# Patient Record
Sex: Female | Born: 1960 | Race: White | Hispanic: No | Smoking: Never smoker
Health system: Southern US, Community
[De-identification: ages and names within clinical notes are randomized; demographics above are authoritative.]

## PROBLEM LIST (undated history)

## (undated) DIAGNOSIS — C187 Malignant neoplasm of sigmoid colon: Secondary | ICD-10-CM

## (undated) DIAGNOSIS — N2889 Other specified disorders of kidney and ureter: Secondary | ICD-10-CM

---

## 2005-05-01 ENCOUNTER — Encounter: Admission: RE | Admit: 2005-05-01 | Discharge: 2005-05-01 | Payer: Self-pay | Admitting: Occupational Medicine

## 2005-05-24 ENCOUNTER — Encounter: Admission: RE | Admit: 2005-05-24 | Discharge: 2005-05-24 | Payer: Self-pay | Admitting: Occupational Medicine

## 2007-07-22 IMAGING — CR DG FOREARM 2V*R*
1 series · 1 of 1 positions shown · non-contrast
Comparison: None.

CLINICAL DATA: Fell ? swelling and bruising in medial forearm. 
 RIGHT FOREARM ? 2 VIEW:

[view not recorded]
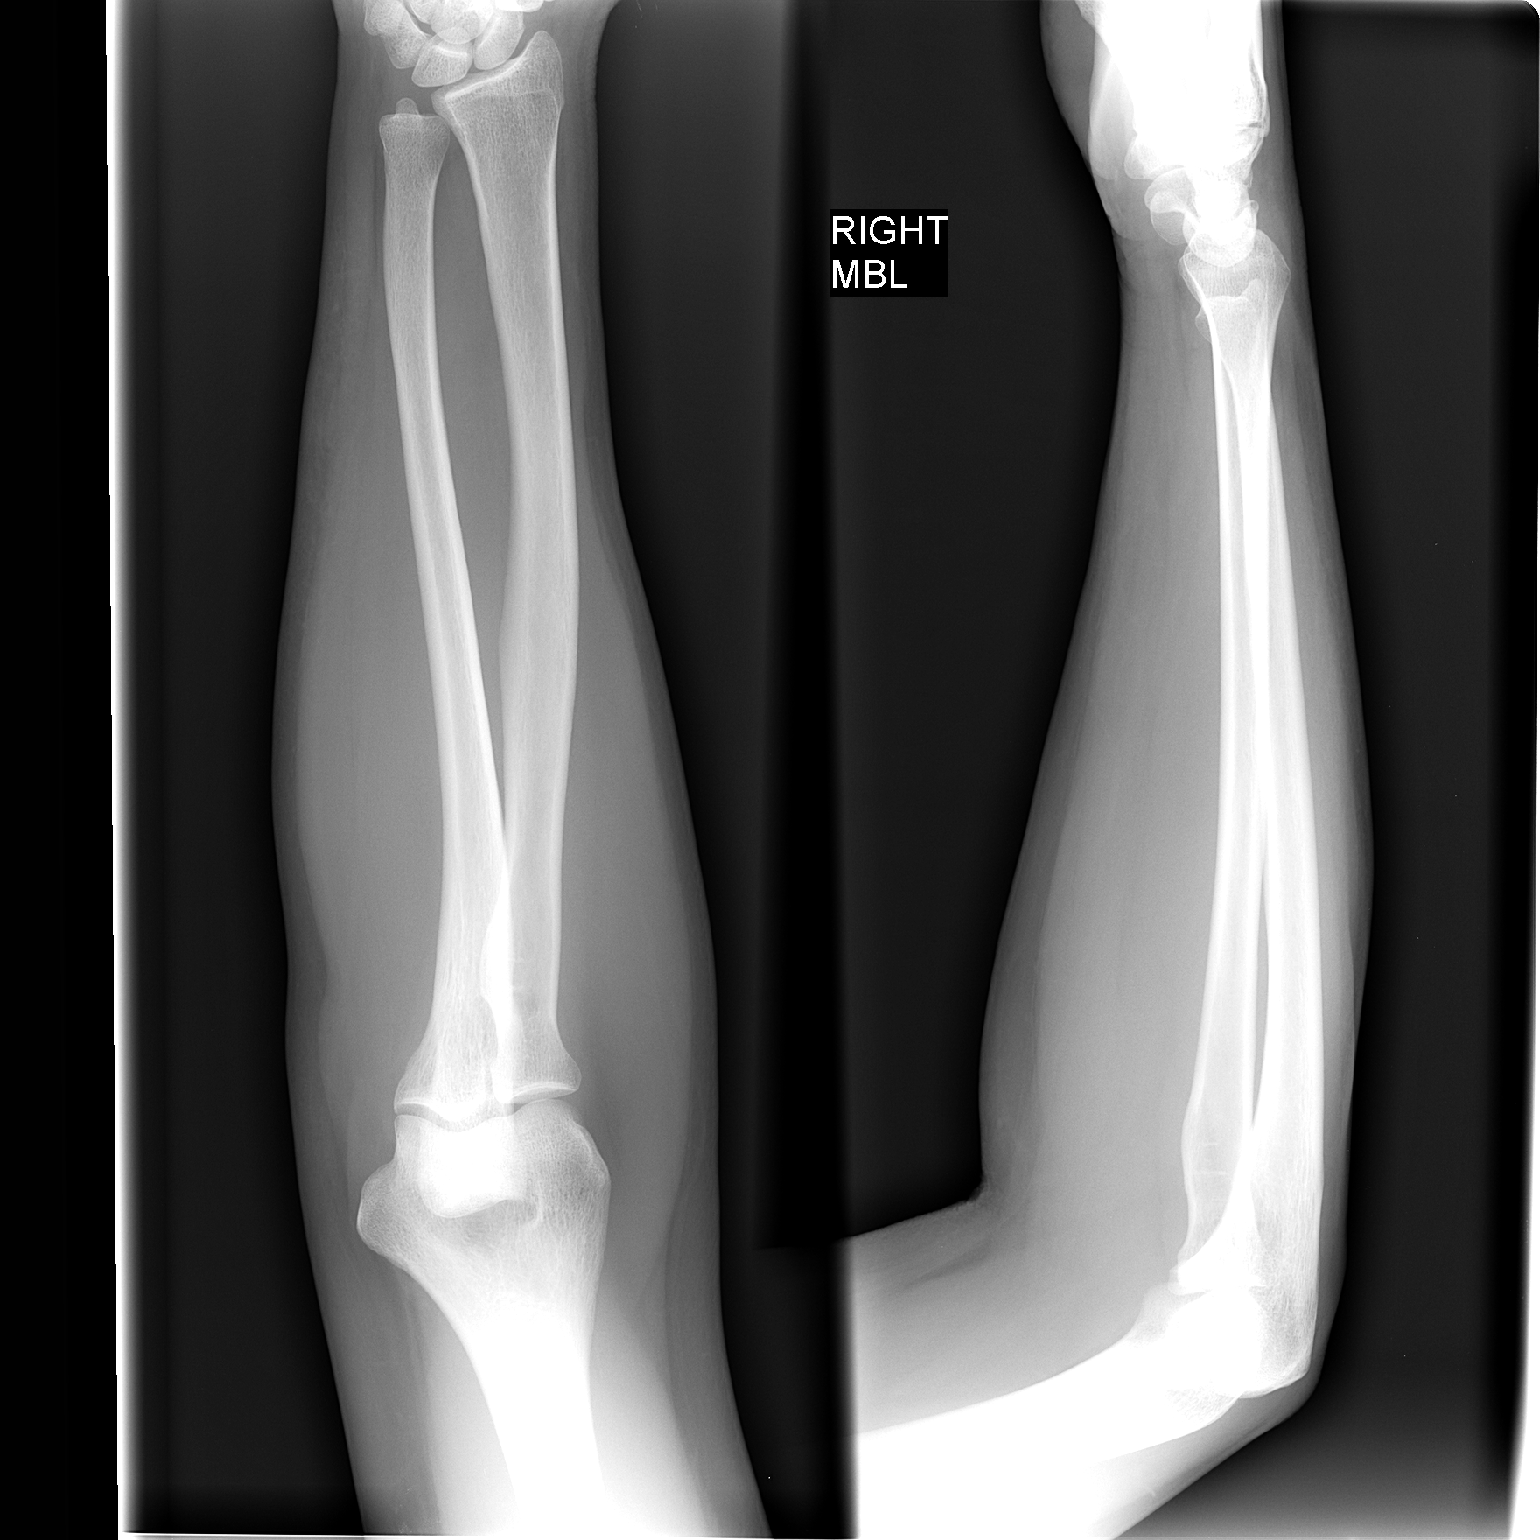

[1 of 1 positions shown; findings below may reference images not displayed]

FINDINGS: There is no evidence of fracture or other focal bone lesions.  Soft tissues are unremarkable.
IMPRESSION: Negative.

## 2016-03-23 ENCOUNTER — Other Ambulatory Visit: Payer: Self-pay | Admitting: Urology

## 2016-03-23 DIAGNOSIS — N2889 Other specified disorders of kidney and ureter: Secondary | ICD-10-CM

## 2016-04-20 ENCOUNTER — Ambulatory Visit
Admission: RE | Admit: 2016-04-20 | Discharge: 2016-04-20 | Disposition: A | Discharge status: Home / Self Care | Payer: Commercial Managed Care - PPO | Source: Ambulatory Visit | Attending: Urology | Admitting: Urology

## 2016-04-20 ENCOUNTER — Encounter: Payer: Self-pay | Admitting: Radiology

## 2016-04-20 DIAGNOSIS — N2889 Other specified disorders of kidney and ureter: Secondary | ICD-10-CM

## 2016-04-20 HISTORY — PX: IR RADIOLOGIST EVAL & MGMT: IMG5224

## 2016-04-20 NOTE — Consult Note (Signed)
Chief Complaint:  Small right inferior renal cell carcinoma. Assess for ablation.   Referring Physician(s): Hall,Marshall C  History of Present Illness: Jaclyn Clarke is a 56 y.o. female with a history of sigmoid colon cancer status post recent partial colectomy and hysterectomy. She is currently undergoing chemotherapy. During her workup and imaging evaluation a small right renal cell carcinoma was incidentally found. This lesion is posterior inferior in the right kidney and slightly exophytic. Maximal dimensions 1.5 cm. She presents today to review treatment options including cryoablation with image guidance. She remains asymptomatic. No current significant abdominal pain, flank pain, dysuria or hematuria. She is mildly fatigued related to the recent chemotherapy. Intermittent nausea.  No past medical history on file.  No past surgical history on file.  Allergies: Codeine; Levofloxacin; Nickel; Chlorthalidone; Hydrochlorothiazide; Influenza vaccines; Penicillins; Propranolol; Eggs or egg-derived products; and Sulfa antibiotics  Medications: Prior to Admission medications   Not on File     No family history on file.  Social History   Social History  . Marital status: Unknown    Spouse name: N/A  . Number of children: N/A  . Years of education: N/A   Social History Main Topics  . Smoking status: Never Smoker  . Smokeless tobacco: Never Used  . Alcohol use No  . Drug use: Unknown  . Sexual activity: Not Asked   Other Topics Concern  . None   Social History Narrative  . None    ECOG Status: 1 - Symptomatic but completely ambulatory  Review of Systems: A 12 point ROS discussed and pertinent positives are indicated in the HPI above.  All other systems are negative.  Review of Systems  Vital Signs: BP 100/69 (BP Location: Right Arm, Patient Position: Sitting, Cuff Size: Normal)   Pulse 94   Temp 99.2 F (37.3 C) (Oral)   Resp 14   Ht 5\' 7"  (1.702 m)    Wt 165 lb (74.8 kg)   SpO2 98%   BMI 25.84 kg/m   Physical Exam  Constitutional: She is oriented to person, place, and time. She appears well-developed and well-nourished. No distress.  Eyes: Conjunctivae are normal. No scleral icterus.  Cardiovascular: Normal rate, regular rhythm and normal heart sounds.   No murmur heard. Pulmonary/Chest: Effort normal and breath sounds normal. No respiratory distress.  Abdominal: Soft. Bowel sounds are normal. She exhibits no distension.  Midline inferior umbilical scar is well-healed.  Musculoskeletal: Normal range of motion. She exhibits no edema.  Right anterior chest port site is well-healed.  Neurological: She is alert and oriented to person, place, and time.  Skin: Skin is dry. No rash noted. She is not diaphoretic. No erythema.  Psychiatric: She has a normal mood and affect. Her behavior is normal. Thought content normal.    Mallampati Score:   2  Imaging: No results found.  Labs:  CBC: No results for input(s): WBC, HGB, HCT, PLT in the last 8760 hours.  COAGS: No results for input(s): INR, APTT in the last 8760 hours.  BMP: No results for input(s): NA, K, CL, CO2, GLUCOSE, BUN, CALCIUM, CREATININE, GFRNONAA, GFRAA in the last 8760 hours.  Invalid input(s): CMP  LIVER FUNCTION TESTS: No results for input(s): BILITOT, AST, ALT, ALKPHOS, PROT, ALBUMIN in the last 8760 hours.  TUMOR MARKERS: No results for input(s): AFPTM, CEA, CA199, CHROMGRNA in the last 8760 hours.  Assessment and Plan:  1.5 cm posterior inferior right renal cell carcinoma. Lesion size and location  are amenable to image guided cryoablation. The procedure, risks, benefits and alternatives were reviewed. Expected goals, outcomes and surveillance also reviewed. She has a clear understanding of the procedure. All questions were addressed. She would like to schedule the procedure electively in late May when she has completed chemotherapy for colon cancer.  Plan:  Schedule for CT-guided right renal cell carcinoma cryoablation in late May at [facility] once she has completed chemotherapy.  Thank you for this interesting consult.  I greatly enjoyed meeting Shealeigh Dunstan and look forward to participating in their care.  A copy of this report was sent to the requesting provider on this date.  Electronically Signed: Greggory Keen 04/20/2016, 4:15 PM   I spent a total of  40 Minutes   in face to face in clinical consultation, greater than 50% of which was counseling/coordinating care for this patient with a small right renal cell carcinoma.

## 2016-06-05 ENCOUNTER — Other Ambulatory Visit (HOSPITAL_COMMUNITY): Payer: Self-pay | Admitting: Interventional Radiology

## 2016-06-05 ENCOUNTER — Other Ambulatory Visit: Payer: Commercial Managed Care - PPO

## 2016-06-05 DIAGNOSIS — N2889 Other specified disorders of kidney and ureter: Secondary | ICD-10-CM

## 2016-06-20 ENCOUNTER — Other Ambulatory Visit: Payer: Commercial Managed Care - PPO

## 2016-06-30 ENCOUNTER — Encounter: Payer: Self-pay | Admitting: Interventional Radiology

## 2016-07-06 ENCOUNTER — Ambulatory Visit
Admission: RE | Admit: 2016-07-06 | Discharge: 2016-07-06 | Disposition: A | Discharge status: Home / Self Care | Payer: Commercial Managed Care - PPO | Source: Ambulatory Visit | Attending: Interventional Radiology | Admitting: Interventional Radiology

## 2016-07-06 DIAGNOSIS — N2889 Other specified disorders of kidney and ureter: Secondary | ICD-10-CM

## 2016-07-06 HISTORY — PX: IR RADIOLOGIST EVAL & MGMT: IMG5224

## 2016-07-06 NOTE — Progress Notes (Signed)
Patient ID: Jaclyn Clarke, female   DOB: 1961-01-04, 57 y.o.   MRN: [id number]       Chief Complaint: 1 month status post right renal cell carcinoma cryoablation.  Referring Physician(s): Awab Abebe  History of Present Illness: Jaclyn Clarke is a 56 y.o. female with a history of sigmoid colon cancer, status post partial colectomy and hysterectomy. She has completed chemotherapy for the colon cancer. Surveillance imaging and workup demonstrated an incidental small right renal cell carcinoma measuring 1.5 cm. This was successfully treated with image guided cryoablation 4 weeks ago at [facility]. Procedure went without complication. She was discharged the same day. She recovered at home very well over the last month. No significant flank pain, abdominal pain, dysuria or hematuria. No interval fevers. Overall she is doing very well.  No past medical history on file.  Past Surgical History:  Procedure Laterality Date  . IR RADIOLOGIST EVAL & MGMT  04/20/2016    Allergies: Codeine; Levofloxacin; Nickel; Chlorthalidone; Hydrochlorothiazide; Influenza vaccines; Penicillins; Propranolol; Eggs or egg-derived products; and Sulfa antibiotics  Medications: Prior to Admission medications   Medication Sig Start Date End Date Taking? Authorizing Provider  clonazePAM (KLONOPIN) 0.5 MG tablet Take 0.5 mg by mouth. 11/16/15  Yes [provider]  clonazePAM (KLONOPIN) 0.5 MG tablet TAKE ONE TABLET BY MOUTH ONCE DAILY AS NEEDED FOR ANXIETY. 04/21/16  Yes [provider]  fluticasone (FLONASE) 50 MCG/ACT nasal spray Place into the nose. 04/02/14  Yes [provider]  lisinopril (PRINIVIL,ZESTRIL) 10 MG tablet Take 10 mg by mouth. 11/22/15  Yes [provider]  ondansetron (ZOFRAN) 8 MG tablet Take 8 mg by mouth. 12/16/15 12/15/16 Yes [provider]  prochlorperazine (COMPAZINE) 10 MG tablet Take 10 mg by mouth. 12/16/15  Yes [provider]    triamcinolone cream (KENALOG) 0.1 % Apply topically. 11/29/15  Yes [provider]     No family history on file.  Social History   Social History  . Marital status: Unknown    Spouse name: N/A  . Number of children: N/A  . Years of education: N/A   Social History Main Topics  . Smoking status: Never Smoker  . Smokeless tobacco: Never Used  . Alcohol use No  . Drug use: Unknown  . Sexual activity: Not on file   Other Topics Concern  . Not on file   Social History Narrative  . No narrative on file    ECOG Status: 0 - Asymptomatic  Review of Systems: A 12 point ROS discussed and pertinent positives are indicated in the HPI above.  All other systems are negative.  Review of Systems  Vital Signs: BP (!) 147/83   Pulse 65   Temp 98.3 F (36.8 C) (Oral)   Resp 14   Ht 5\' 7"  (1.702 m)   Wt 164 lb (74.4 kg)   SpO2 98%   BMI 25.69 kg/m   Physical Exam  Constitutional: She is oriented to person, place, and time. She appears well-developed and well-nourished. No distress.  Eyes: Conjunctivae are normal. No scleral icterus.  Cardiovascular: Normal rate and regular rhythm.   No murmur heard. Pulmonary/Chest: Effort normal and breath sounds normal. No respiratory distress.  Abdominal: Soft. Bowel sounds are normal. She exhibits no distension.  Musculoskeletal: She exhibits no edema or deformity.  Neurological: She is alert and oriented to person, place, and time.  Skin: Skin is warm and dry. She is not diaphoretic. No erythema.  Psychiatric: She has a normal mood  and affect. Her behavior is normal.    Mallampati Score:   1  Imaging: No results found.  Labs:  CBC: No results for input(s): WBC, HGB, HCT, PLT in the last 8760 hours.  COAGS: No results for input(s): INR, APTT in the last 8760 hours.  BMP: No results for input(s): NA, K, CL, CO2, GLUCOSE, BUN, CALCIUM, CREATININE, GFRNONAA, GFRAA in the last 8760 hours.  Invalid input(s):  CMP  LIVER FUNCTION TESTS: No results for input(s): BILITOT, AST, ALT, ALKPHOS, PROT, ALBUMIN in the last 8760 hours.  TUMOR MARKERS: No results for input(s): AFPTM, CEA, CA199, CHROMGRNA in the last 8760 hours.  Assessment and Plan:  1 month status post right renal cell carcinoma cryoablation. Overall she is recovered very well. She is currently asymptomatic.  Plan: Schedule initial surveillance imaging in 3 months with a CT without and with contrast at [place].  Electronically Signed: Greggory Keen 07/06/2016, 9:30 AM   I spent a total of    25 Minutes in face to face in clinical consultation, greater than 50% of which was counseling/coordinating care for this patient with a right renal cell carcinoma.

## 2016-10-03 ENCOUNTER — Other Ambulatory Visit (HOSPITAL_COMMUNITY): Payer: Self-pay | Admitting: Interventional Radiology

## 2016-10-03 ENCOUNTER — Other Ambulatory Visit: Payer: Self-pay | Admitting: Radiology

## 2016-10-03 DIAGNOSIS — N2889 Other specified disorders of kidney and ureter: Secondary | ICD-10-CM

## 2016-10-03 DIAGNOSIS — C641 Malignant neoplasm of right kidney, except renal pelvis: Secondary | ICD-10-CM

## 2016-10-31 ENCOUNTER — Ambulatory Visit
Admission: RE | Admit: 2016-10-31 | Discharge: 2016-10-31 | Disposition: A | Discharge status: Home / Self Care | Payer: Commercial Managed Care - PPO | Source: Ambulatory Visit | Attending: Interventional Radiology | Admitting: Interventional Radiology

## 2016-10-31 DIAGNOSIS — N2889 Other specified disorders of kidney and ureter: Secondary | ICD-10-CM

## 2016-10-31 HISTORY — DX: Other specified disorders of kidney and ureter: N28.89

## 2016-10-31 HISTORY — PX: IR RADIOLOGIST EVAL & MGMT: IMG5224

## 2016-10-31 HISTORY — DX: Malignant neoplasm of sigmoid colon: C18.7

## 2016-10-31 NOTE — Progress Notes (Signed)
Patient ID: Jaclyn Clarke, female   DOB: 01-21-1960, 56 y.o.   MRN: [id number]       Chief Complaint:   5 months status post right renal cell carcinoma cryoablation  Referring Physician(s): Geraldine Sandberg  History of Present Illness: Jaclyn Clarke is a 56 y.o. female with a prior history of sigmoid colon cancer, status post partial colectomy and hysterectomy. She has completed chemotherapy for the colon cancer. A small right renal cell carcinoma measuring 1.5 cm in the lower pole was demonstrated by surveillance imaging. This was treated with CT-guided cryoablation at [facility] in May 2018. She continues to recover at home very well. She remains asymptomatic. No flank pain, abdominal pain, dysuria or hematuria. No interval fevers. She reports chronic fatigue, low energy, and weight loss. She is not back to work at this time.  Past Medical History:  Diagnosis Date  . Malignant neoplasm of sigmoid colon ([place])   . Right renal mass     Past Surgical History:  Procedure Laterality Date  . IR RADIOLOGIST EVAL & MGMT  04/20/2016    Allergies: Codeine; Levofloxacin; Nickel; Chlorthalidone; Hydrochlorothiazide; Influenza vaccines; Penicillins; Propranolol; Eggs or egg-derived products; and Sulfa antibiotics  Medications: Prior to Admission medications   Medication Sig Start Date End Date Taking? Authorizing Provider  clonazePAM (KLONOPIN) 0.5 MG tablet TAKE ONE TABLET BY MOUTH ONCE DAILY AS NEEDED FOR ANXIETY. 04/21/16  Yes [provider]  lisinopril (PRINIVIL,ZESTRIL) 10 MG tablet Take 10 mg by mouth. 11/22/15  Yes [provider]  ondansetron (ZOFRAN) 8 MG tablet Take 8 mg by mouth. 12/16/15 12/15/16 Yes [provider]  prochlorperazine (COMPAZINE) 10 MG tablet Take 10 mg by mouth. 12/16/15  Yes [provider]  triamcinolone cream (KENALOG) 0.1 % Apply topically. 11/29/15  Yes [provider]  fluticasone (FLONASE) 50 MCG/ACT nasal  spray Place into the nose. 04/02/14   [provider]     No family history on file.  Social History   Social History  . Marital status: Unknown    Spouse name: N/A  . Number of children: N/A  . Years of education: N/A   Social History Main Topics  . Smoking status: Never Smoker  . Smokeless tobacco: Never Used  . Alcohol use No  . Drug use: Unknown  . Sexual activity: Not on file   Other Topics Concern  . Not on file   Social History Narrative  . No narrative on file    ECOG Status: 1 - Symptomatic but completely ambulatory  Review of Systems: A 12 point ROS discussed and pertinent positives are indicated in the HPI above.  All other systems are negative.  Review of Systems  Vital Signs: BP 126/76   Pulse 61   Temp 98.3 F (36.8 C) (Oral)   Resp 14   Ht 5\' 7"  (1.702 m)   Wt 149 lb 3.2 oz (67.7 kg)   SpO2 100%   BMI 23.37 kg/m   Physical Exam  Constitutional: She is oriented to person, place, and time. She appears well-developed and well-nourished. No distress.  Eyes: Conjunctivae are normal. No scleral icterus.  Cardiovascular: Normal rate, regular rhythm and normal heart sounds.   No murmur heard. Pulmonary/Chest: Effort normal and breath sounds normal. No respiratory distress.  Abdominal: Soft. Bowel sounds are normal. She exhibits no distension.  Musculoskeletal: Normal range of motion. She exhibits no edema.  Neurological: She is alert and oriented to person, place, and time.  Skin: She is not  diaphoretic.      Imaging: No results found.  Labs:  CBC: No results for input(s): WBC, HGB, HCT, PLT in the last 8760 hours.  COAGS: No results for input(s): INR, APTT in the last 8760 hours.  BMP: No results for input(s): NA, K, CL, CO2, GLUCOSE, BUN, CALCIUM, CREATININE, GFRNONAA, GFRAA in the last 8760 hours.  Invalid input(s): CMP  LIVER FUNCTION TESTS: No results for input(s): BILITOT, AST, ALT, ALKPHOS, PROT, ALBUMIN in the last  8760 hours.  TUMOR MARKERS: No results for input(s): AFPTM, CEA, CA199, CHROMGRNA in the last 8760 hours.  Assessment and Plan:  5 months status post right renal cell carcinoma cryoablation. Repeat imaging 10/26/2016 performed at [facility] demonstrates an ablation defect in the right kidney lower pole with no evidence of residual or recurrent tumor. No delay complication. Stable left 3 cm angiomyolipoma also noted. Overall she is doing very well except for her chronic fatigue, low energy level, and weight loss. She was encouraged to slowly begin exercise in the form of walking daily. Hopefully this will improve her energy level and stamina and she can get back to work part-time. If this continues I would recommend evaluation by her primary doctor for depression.  Plan: Follow-up CT abdomen without and with contrast in [place] 6 months from now with an outpatient visit.   Electronically Signed: Greggory Keen 10/31/2016, 9:41 AM   I spent a total of    40 Minutes in face to face in clinical consultation, greater than 50% of which was counseling/coordinating care for this patient status post cryoablation of a right renal cell carcinoma.

## 2016-11-15 ENCOUNTER — Encounter: Payer: Self-pay | Admitting: Interventional Radiology

## 2016-11-23 ENCOUNTER — Encounter: Payer: Self-pay | Admitting: Interventional Radiology

## 2017-07-11 ENCOUNTER — Other Ambulatory Visit: Payer: Self-pay | Admitting: Interventional Radiology

## 2017-07-11 ENCOUNTER — Other Ambulatory Visit: Payer: Self-pay | Admitting: Radiology

## 2017-07-11 DIAGNOSIS — N2889 Other specified disorders of kidney and ureter: Secondary | ICD-10-CM

## 2017-08-02 ENCOUNTER — Telehealth: Payer: Self-pay | Admitting: Radiology

## 2017-08-02 NOTE — Telephone Encounter (Signed)
Our office had planned to order CT for July 2019 to follow Cryoablation is of Right Renal Cell Carcinoma.  We found that the patient had CT Abd w/ & /wo contrast performed 06/04/2017 (ordered by another provider).  Dr Annamaria Boots review imaging---cryoablation site is stable.  Per Dr Annamaria Boots our plan is to follow up one year.  Phoned patient to review CT results of 06/04/2017 and plan.  Patient agrees.  Anjel Perfetti Riki Rusk, [place] 08/02/2017 9:29 AM

## 2017-09-16 DEATH — deceased
